# Patient Record
Sex: Male | Born: 1970 | Race: White | Hispanic: No | Marital: Married | Smoking: Never smoker
Health system: Southern US, Community
[De-identification: ages and names within clinical notes are randomized; demographics above are authoritative.]

## PROBLEM LIST (undated history)

## (undated) ENCOUNTER — Emergency Department (HOSPITAL_COMMUNITY): Payer: Managed Care, Other (non HMO) | Source: Home / Self Care

---

## 2015-11-22 ENCOUNTER — Encounter (HOSPITAL_COMMUNITY): Payer: Self-pay

## 2015-11-22 ENCOUNTER — Emergency Department (HOSPITAL_COMMUNITY): Payer: Managed Care, Other (non HMO)

## 2015-11-22 ENCOUNTER — Emergency Department (HOSPITAL_COMMUNITY)
Admission: EM | Admit: 2015-11-22 | Discharge: 2015-11-22 | Disposition: A | Discharge status: Home / Self Care | Payer: Managed Care, Other (non HMO) | Attending: Emergency Medicine | Admitting: Emergency Medicine

## 2015-11-22 DIAGNOSIS — R1013 Epigastric pain: Secondary | ICD-10-CM

## 2015-11-22 DIAGNOSIS — K2970 Gastritis, unspecified, without bleeding: Secondary | ICD-10-CM | POA: Insufficient documentation

## 2015-11-22 DIAGNOSIS — Z79899 Other long term (current) drug therapy: Secondary | ICD-10-CM | POA: Insufficient documentation

## 2015-11-22 LAB — CBC
HCT: 44.4 % (ref 39.0–52.0)
Hemoglobin: 15.4 g/dL (ref 13.0–17.0)
MCH: 30.4 pg (ref 26.0–34.0)
MCHC: 34.7 g/dL (ref 30.0–36.0)
MCV: 87.7 fL (ref 78.0–100.0)
PLATELETS: 215 10*3/uL (ref 150–400)
RBC: 5.06 MIL/uL (ref 4.22–5.81)
RDW: 12.9 % (ref 11.5–15.5)
WBC: 13.6 10*3/uL — ABNORMAL HIGH (ref 4.0–10.5)

## 2015-11-22 LAB — URINE MICROSCOPIC-ADD ON

## 2015-11-22 LAB — COMPREHENSIVE METABOLIC PANEL
ALBUMIN: 4.6 g/dL (ref 3.5–5.0)
ALK PHOS: 60 U/L (ref 38–126)
ALT: 46 U/L (ref 17–63)
AST: 33 U/L (ref 15–41)
Anion gap: 8 (ref 5–15)
BUN: 19 mg/dL (ref 6–20)
CALCIUM: 9.2 mg/dL (ref 8.9–10.3)
CO2: 25 mmol/L (ref 22–32)
CREATININE: 0.89 mg/dL (ref 0.61–1.24)
Chloride: 104 mmol/L (ref 101–111)
GFR calc non Af Amer: >60 mL/min (ref >60)
GLUCOSE: 130 mg/dL — AB (ref 65–99)
Potassium: 4.1 mmol/L (ref 3.5–5.1)
SODIUM: 137 mmol/L (ref 135–145)
Total Bilirubin: 0.9 mg/dL (ref 0.3–1.2)
Total Protein: 7.6 g/dL (ref 6.5–8.1)

## 2015-11-22 LAB — URINALYSIS, ROUTINE W REFLEX MICROSCOPIC
BILIRUBIN URINE: NEGATIVE
Glucose, UA: NEGATIVE mg/dL
Ketones, ur: NEGATIVE mg/dL
Leukocytes, UA: NEGATIVE
Nitrite: NEGATIVE
PROTEIN: NEGATIVE mg/dL
Specific Gravity, Urine: 1.033 — ABNORMAL HIGH (ref 1.005–1.030)
pH: 5.5 (ref 5.0–8.0)

## 2015-11-22 LAB — LIPASE, BLOOD: Lipase: 14 U/L (ref 11–51)

## 2015-11-22 MED ORDER — GI COCKTAIL ~~LOC~~
30.0000 mL | Freq: Once | ORAL | Status: AC
  Administered 2015-11-22: 30 mL via ORAL
  Filled 2015-11-22: qty 30

## 2015-11-22 MED ORDER — FAMOTIDINE IN NACL 20-0.9 MG/50ML-% IV SOLN
20.0000 mg | Freq: Once | INTRAVENOUS | Status: AC
  Administered 2015-11-22: 20 mg via INTRAVENOUS
  Filled 2015-11-22: qty 50

## 2015-11-22 MED ORDER — OXYCODONE-ACETAMINOPHEN 5-325 MG PO TABS: ORAL_TABLET | ORAL | Status: AC

## 2015-11-22 MED ORDER — MORPHINE SULFATE (PF) 2 MG/ML IV SOLN
2.0000 mg | Freq: Once | INTRAVENOUS | Status: AC
  Administered 2015-11-22: 2 mg via INTRAVENOUS
  Filled 2015-11-22: qty 1

## 2015-11-22 MED ORDER — IOPAMIDOL (ISOVUE-300) INJECTION 61%
100.0000 mL | Freq: Once | INTRAVENOUS | Status: AC | PRN
  Administered 2015-11-22: 100 mL via INTRAVENOUS

## 2015-11-22 MED ORDER — SUCRALFATE 1 G PO TABS
1.0000 g | ORAL_TABLET | Freq: Once | ORAL | Status: AC
  Administered 2015-11-22: 1 g via ORAL
  Filled 2015-11-22: qty 1

## 2015-11-22 MED ORDER — DIATRIZOATE MEGLUMINE & SODIUM 66-10 % PO SOLN
15.0000 mL | Freq: Once | ORAL | Status: AC
  Administered 2015-11-22: 15 mL via ORAL

## 2015-11-22 MED ORDER — SODIUM CHLORIDE 0.9 % IV BOLUS (SEPSIS)
1000.0000 mL | Freq: Once | INTRAVENOUS | Status: AC
  Administered 2015-11-22: 1000 mL via INTRAVENOUS

## 2015-11-22 MED ORDER — SUCRALFATE 1 GM/10ML PO SUSP: 1.0000 g | Freq: Three times a day (TID) | ORAL | Status: AC

## 2015-11-22 MED ORDER — OXYCODONE-ACETAMINOPHEN 5-325 MG PO TABS
1.0000 | ORAL_TABLET | Freq: Once | ORAL | Status: AC
  Administered 2015-11-22: 1 via ORAL
  Filled 2015-11-22: qty 1

## 2015-11-22 MED ORDER — PANTOPRAZOLE SODIUM 20 MG PO TBEC: 40.0000 mg | DELAYED_RELEASE_TABLET | Freq: Every day | ORAL | Status: AC

## 2015-11-22 MED ORDER — ONDANSETRON HCL 4 MG/2ML IJ SOLN
4.0000 mg | Freq: Once | INTRAMUSCULAR | Status: AC
  Administered 2015-11-22: 4 mg via INTRAVENOUS
  Filled 2015-11-22: qty 2

## 2015-11-22 MED ORDER — MORPHINE SULFATE (PF) 4 MG/ML IV SOLN
4.0000 mg | Freq: Once | INTRAVENOUS | Status: AC
  Administered 2015-11-22: 4 mg via INTRAVENOUS
  Filled 2015-11-22: qty 1

## 2015-11-22 NOTE — ED Notes (Signed)
Pt states at 1:30 am starting having central abdominal pain. Similar incident x 1 year ago.  Non radiating to back.  Denies n/v/d.  No fever.  No change in urination.  No hx of gastric dx.

## 2015-11-22 NOTE — Discharge Instructions (Signed)
Take percocet for breakthrough pain, do not drink alcohol, drive, care for children or do other critical tasks while taking percocet.  Please follow with your primary care doctor in the next 2 days for a check-up. They must obtain records for further management.   Do not hesitate to return to the Emergency Department for any new, worsening or concerning symptoms.   Please discuss getting a gastroenterology evaluation with your primary care doctor.   Gastritis, Adult Gastritis is soreness and swelling (inflammation) of the lining of the stomach. Gastritis can develop as a sudden onset (acute) or long-term (chronic) condition. If gastritis is not treated, it can lead to stomach bleeding and ulcers. CAUSES  Gastritis occurs when the stomach lining is weak or damaged. Digestive juices from the stomach then inflame the weakened stomach lining. The stomach lining may be weak or damaged due to viral or bacterial infections. One common bacterial infection is the Helicobacter pylori infection. Gastritis can also result from excessive alcohol consumption, taking certain medicines, or having too much acid in the stomach.  SYMPTOMS  In some cases, there are no symptoms. When symptoms are present, they may include:  Pain or a burning sensation in the upper abdomen.  Nausea.  Vomiting.  An uncomfortable feeling of fullness after eating. DIAGNOSIS  Your caregiver may suspect you have gastritis based on your symptoms and a physical exam. To determine the cause of your gastritis, your caregiver may perform the following:  Blood or stool tests to check for the H pylori bacterium.  Gastroscopy. A thin, flexible tube (endoscope) is passed down the esophagus and into the stomach. The endoscope has a light and camera on the end. Your caregiver uses the endoscope to view the inside of the stomach.  Taking a tissue sample (biopsy) from the stomach to examine under a microscope. TREATMENT  Depending on the  cause of your gastritis, medicines may be prescribed. If you have a bacterial infection, such as an H pylori infection, antibiotics may be given. If your gastritis is caused by too much acid in the stomach, H2 blockers or antacids may be given. Your caregiver may recommend that you stop taking aspirin, ibuprofen, or other nonsteroidal anti-inflammatory drugs (NSAIDs). HOME CARE INSTRUCTIONS  Only take over-the-counter or prescription medicines as directed by your caregiver.  If you were given antibiotic medicines, take them as directed. Finish them even if you start to feel better.  Drink enough fluids to keep your urine clear or pale yellow.  Avoid foods and drinks that make your symptoms worse, such as:  Caffeine or alcoholic drinks.  Chocolate.  Peppermint or mint flavorings.  Garlic and onions.  Spicy foods.  Citrus fruits, such as oranges, lemons, or limes.  Tomato-based foods such as sauce, chili, salsa, and pizza.  Fried and fatty foods.  Eat small, frequent meals instead of large meals. SEEK IMMEDIATE MEDICAL CARE IF:   You have black or dark red stools.  You vomit blood or material that looks like coffee grounds.  You are unable to keep fluids down.  Your abdominal pain gets worse.  You have a fever.  You do not feel better after 1 week.  You have any other questions or concerns. MAKE SURE YOU:  Understand these instructions.  Will watch your condition.  Will get help right away if you are not doing well or get worse.   This information is not intended to replace advice given to you by your health care provider. Make sure you discuss  any questions you have with your health care provider.   Document Released: 04/18/2001 Document Revised: 10/24/2011 Document Reviewed: 06/07/2011 Elsevier Interactive Patient Education [employer]2016 [employer]

## 2015-11-22 NOTE — ED Provider Notes (Signed)
CSN: [id number]     Arrival date & time 11/22/15  0913 History   First MD Initiated Contact with Patient 11/22/15 0945     Chief Complaint  Patient presents with  . Abdominal Pain     (Consider location/radiation/quality/duration/timing/severity/associated sxs/prior Treatment) HPI  Blood pressure 144/99, pulse 69, temperature 97.4 F (36.3 C), temperature source Oral, resp. rate 18, SpO2 100 %.  Antonio GulaBrian Siebers is a 45 y.o. male complaining of severe epigastric pain onset at 1:30 AM, woke him from sleep patient denies nausea, vomiting, fever, chills, change in urination states that stool is more pebble-like than normal. Denies melena, hematochezia, chest pain, shortness of breath, lightheadedness or fatigue. Location including Gas-X with little relief. Patient states he was seen for similar about 16 months ago and peripheral artery, he had a negative CT, it was recommended he obtain ultrasound. Of note, patient was given Dilaudid and his systolic blood pressure went to 80. She was advised to get an outpatient ultrasound of his gallbladder but he couldn't afford it. He denies postprandial exacerbation of pain.  History reviewed. No pertinent past medical history. History reviewed. No pertinent past surgical history. History reviewed. No pertinent family history. Social History  Substance Use Topics  . Smoking status: Never Smoker   . Smokeless tobacco: None  . Alcohol Use: Yes     Comment: social    Review of Systems  10 systems reviewed and found to be negative, except as noted in the HPI.  Allergies  Dilaudid  Home Medications   Prior to Admission medications   Medication Sig Start Date End Date Taking? Authorizing Provider  clonazePAM (KLONOPIN) 0.5 MG tablet Take 0.5 mg by mouth daily with breakfast. May also take 2 more a day if needed 08/18/15  Yes Historical Provider, MD  famotidine (PEPCID AC) 10 MG chewable tablet Chew 10 mg by mouth 2 (two) times daily as needed for  heartburn.   Yes Historical Provider, MD  sertraline (ZOLOFT) 100 MG tablet Take 100 mg by mouth daily with breakfast. 11/22/15  Yes Historical Provider, MD  simethicone (GAS-X) 80 MG chewable tablet Chew 160 mg by mouth every 6 (six) hours as needed for flatulence.   Yes Historical Provider, MD  oxyCODONE-acetaminophen (PERCOCET/ROXICET) 5-325 MG tablet 1 to 2 tabs PO q6hrs  PRN for pain 11/22/15   Joni ReiningNicole Raydon Chappuis, PA-C  pantoprazole (PROTONIX) 20 MG tablet Take 2 tablets (40 mg total) by mouth daily. 11/22/15   Kieffer Blatz, PA-C  sucralfate (CARAFATE) 1 GM/10ML suspension Take 10 mLs (1 g total) by mouth 4 (four) times daily -  with meals and at bedtime. 11/22/15   Maddilynn Esperanza, PA-C   BP 144/81 mmHg  Pulse 72  Temp(Src) 97.7 F (36.5 C) (Oral)  Resp 18  SpO2 98% Physical Exam  Constitutional: He is oriented to person, place, and time. He appears well-developed and well-nourished. No distress.  Appears acutely uncomfortable.  HENT:  Head: Normocephalic and atraumatic.  Mouth/Throat: Oropharynx is clear and moist.  Eyes: Conjunctivae and EOM are normal. Pupils are equal, round, and reactive to light.  Neck: Normal range of motion.  Cardiovascular: Normal rate, regular rhythm and intact distal pulses.   Pulmonary/Chest: Effort normal and breath sounds normal. No stridor.  Abdominal: Soft. Bowel sounds are normal. He exhibits no distension and no mass. There is no tenderness. There is no rebound and no guarding.  Normal bowel sounds, no tenderness to palpation of any quadrant  Musculoskeletal: Normal range of motion.  Neurological: He  is alert and oriented to person, place, and time.  Skin: He is not diaphoretic.  Psychiatric: He has a normal mood and affect.  Nursing note and vitals reviewed.   ED Course  Procedures (including critical care time) Labs Review Labs Reviewed  COMPREHENSIVE METABOLIC PANEL - Abnormal; Notable for the following:    Glucose, Bld 130 (*)    All  other components within normal limits  CBC - Abnormal; Notable for the following:    WBC 13.6 (*)    All other components within normal limits  URINALYSIS, ROUTINE W REFLEX MICROSCOPIC (NOT AT [facility]) - Abnormal; Notable for the following:    APPearance TURBID (*)    Specific Gravity, Urine 1.033 (*)    Hgb urine dipstick TRACE (*)    All other components within normal limits  URINE MICROSCOPIC-ADD ON - Abnormal; Notable for the following:    Squamous Epithelial / LPF 0-5 (*)    Bacteria, UA FEW (*)    All other components within normal limits  LIPASE, BLOOD    Imaging Review Ct Abdomen Pelvis W Contrast  11/22/2015  CLINICAL DATA:  Epigastric abdominal pain EXAM: CT ABDOMEN AND PELVIS WITH CONTRAST TECHNIQUE: Multidetector CT imaging of the abdomen and pelvis was performed using the standard protocol following bolus administration of intravenous contrast. CONTRAST:  ISOVUE-300 IOPAMIDOL (ISOVUE-300) INJECTION 61% COMPARISON:  Right upper quadrant abdominal ultrasound dated 11/22/2015 FINDINGS: Lower chest: Minimal dependent atelectasis in the bilateral lower lobes. Hepatobiliary: Liver is notable for hepatic steatosis with focal fatty sparing along the gallbladder fossa. Gallbladder is unremarkable. No intrahepatic or extrahepatic ductal dilatation. Pancreas: Within normal limits. Spleen: Within normal limits. Adrenals/Urinary Tract: Adrenal glands are within normal limits. 1.3 cm cyst in the lateral interpolar left kidney (series 2/ image 46). Right kidney is within normal limits. No hydronephrosis. Bladder is within normal limits. Stomach/Bowel: Stomach is within normal limits. No evidence of bowel obstruction. Normal appendix (series 2/ image 70). Vascular/Lymphatic: No evidence of abdominal aortic aneurysm. No suspicious abdominopelvic lymphadenopathy. Reproductive: Prostate is unremarkable. Other: No abdominopelvic ascites. Musculoskeletal: Mild degenerative changes of the visualized  thoracolumbar spine. 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 IMPRESSION: No evidence of bowel obstruction.  Normal appendix. Hepatic steatosis. No CT findings to account for the patient's abdominal pain. Electronically Signed   By: Charline Bills M.D.   On: 11/22/2015 11:57   US Abdomen Limited Ruq  11/22/2015  CLINICAL DATA:  Acute abdominal pain. EXAM: US ABDOMEN LIMITED - RIGHT UPPER QUADRANT COMPARISON:  None. FINDINGS: Gallbladder: Tiny stones appear to be present in the neck. These appear to be non mobile and measure up to 6 mm. No gallbladder wall thickening. No pericholecystic fluid collection. Common bile duct: Diameter: 3.1 mm Liver: Liver is is echogenic suggesting fatty infiltration and/or hepatocellular disease . No focal hepatic abnormality identified. IMPRESSION: 1. Tiny stones appear to be present in the gallbladder neck. These appear to be non mobile and measure up to 6 mm . No gallbladder distention. No gallbladder wall thickening or pericholecystic fluid collection. No biliary distention. 2. Liver is echogenic suggesting fatty infiltration and/or hepatocellular disease. Electronically Signed   By: Maisie Fus  Register   On: 11/22/2015 10:55   I have personally reviewed and evaluated these images and lab results as part of my medical decision-making.   EKG Interpretation None      MDM   Final diagnoses:  Acute epigastric pain  Gastritis    Filed Vitals:   11/22/15 0933 11/22/15 1158  BP: 144/99 144/81  Pulse: 69 72  Temp: 97.4 F (36.3 C) 97.7 F (36.5 C)  TempSrc: Oral Oral  Resp: 18 18  SpO2: 100% 98%    Medications  sucralfate (CARAFATE) tablet 1 g (not administered)  famotidine (PEPCID) IVPB 20 mg premix (not administered)  oxyCODONE-acetaminophen (PERCOCET/ROXICET) 5-325 MG per tablet 1 tablet (not administered)  morphine 2 MG/ML injection 2 mg (2 mg Intravenous Given 11/22/15 1018)  ondansetron (ZOFRAN) injection 4 mg (4 mg Intravenous  Given 11/22/15 1018)  sodium chloride 0.9 % bolus 1,000 mL (1,000 mLs Intravenous New Bag/Given 11/22/15 1018)  gi cocktail (Maalox,Lidocaine,Donnatal) (30 mLs Oral Given 11/22/15 1018)  diatrizoate meglumine-sodium (GASTROGRAFIN) 66-10 % solution 15 mL (15 mLs Oral Given 11/22/15 1027)  morphine 4 MG/ML injection 4 mg (4 mg Intravenous Given 11/22/15 1111)  iopamidol (ISOVUE-300) 61 % injection 100 mL (100 mLs Intravenous Contrast Given 11/22/15 1132)    Antonio Ibarra is 45 y.o. male presenting with Severe mid abdominal pain, similar episode one year ago. Abd exam is nonsurgical, patient is afebrile and nontoxic appearing, he does appear acutely uncomfortable. I doubt this is an anginal equivalent. He shouldn't has mild leukocytosis of 13.6, no significant electrolyte abnormality, specifically normal lipase and LFTs. Urinalysis is not consistent with infection, right upper quadrant ultrasound with some small gallstones and no wall thickening consistent with a cholecystitis. CT with no significant abnormality. This is likely a severe gastritis, I've advised this patient who is visiting the area to follow closely with his primary care physician for gastroenterology referral and possible EGD, will give him a prescription for Protonix and Carafate.  Evaluation does not show pathology that would require ongoing emergent intervention or inpatient treatment. Pt is hemodynamically stable and mentating appropriately. Discussed findings and plan with patient/guardian, who agrees with care plan. All questions answered. Return precautions discussed and outpatient follow up given.   New Prescriptions   OXYCODONE-ACETAMINOPHEN (PERCOCET/ROXICET) 5-325 MG TABLET    1 to 2 tabs PO q6hrs  PRN for pain   PANTOPRAZOLE (PROTONIX) 20 MG TABLET    Take 2 tablets (40 mg total) by mouth daily.   SUCRALFATE (CARAFATE) 1 GM/10ML SUSPENSION    Take 10 mLs (1 g total) by mouth 4 (four) times daily -  with meals and at bedtime.          Wynetta Emery, PA-C 11/22/15 1224  Lorre Nick, MD 11/24/15 1108

## 2015-11-22 NOTE — ED Notes (Signed)
Pt states he can call his friend to pick him up.

## 2015-11-22 NOTE — ED Notes (Signed)
Ambulated to the BR with steady gait

## 2017-03-01 IMAGING — US US ABDOMEN LIMITED
1 series · 14 of 25 positions shown · non-contrast
Comparison: None.

CLINICAL DATA: Acute abdominal pain.

EXAM:
US ABDOMEN LIMITED - RIGHT UPPER QUADRANT

[Series 1: us abdomen limited · 0.26mm/px · 14 of 86 slices shown]
[im 1/86]
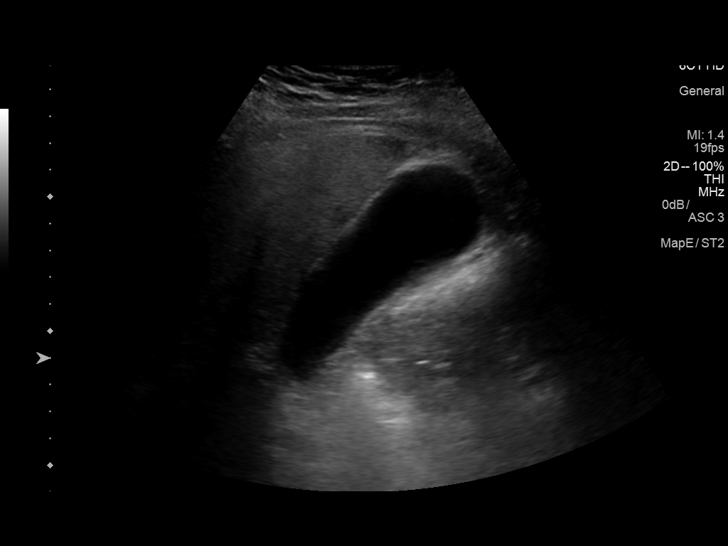
[im 8/86]
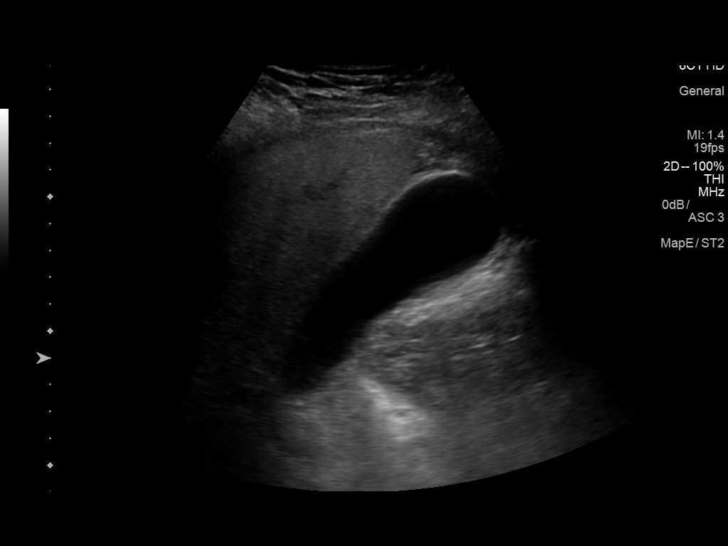
[im 15/86]
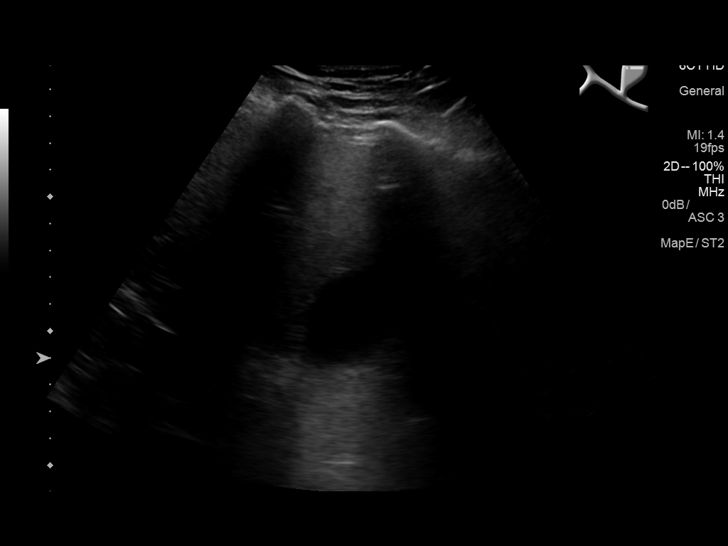
[im 22/86]
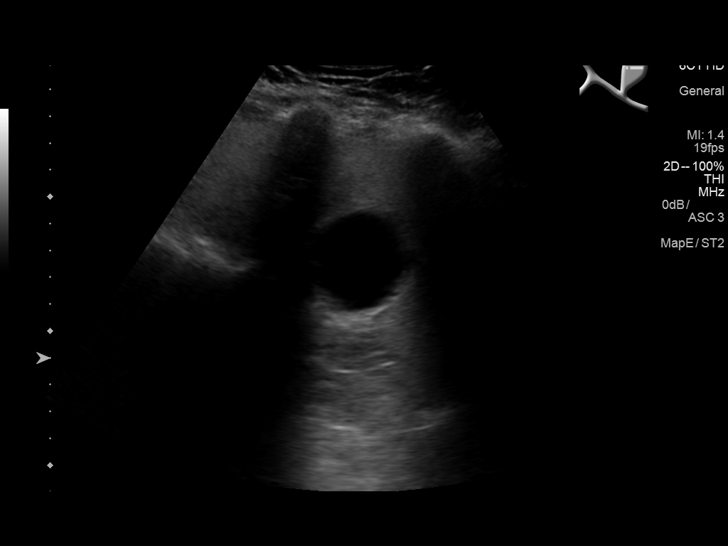
[im 29/86]
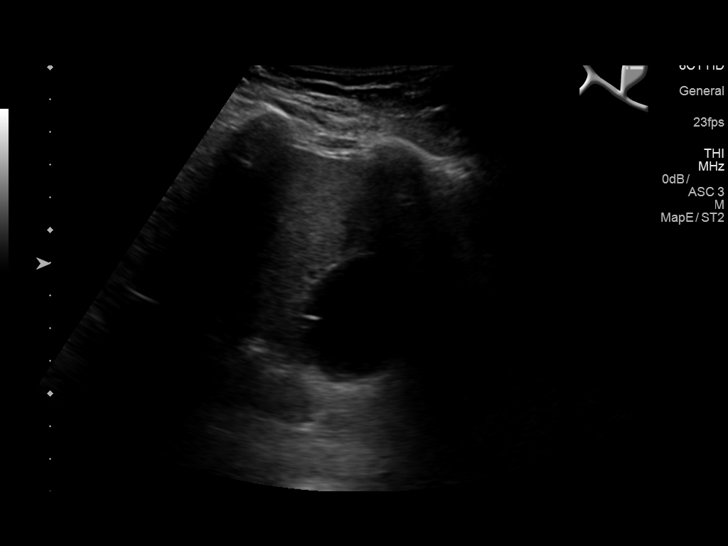
[im 32/86]
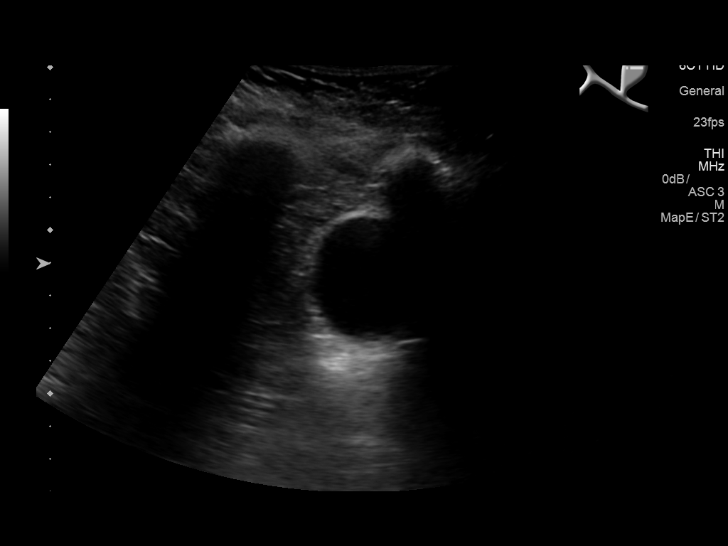
[im 39/86]
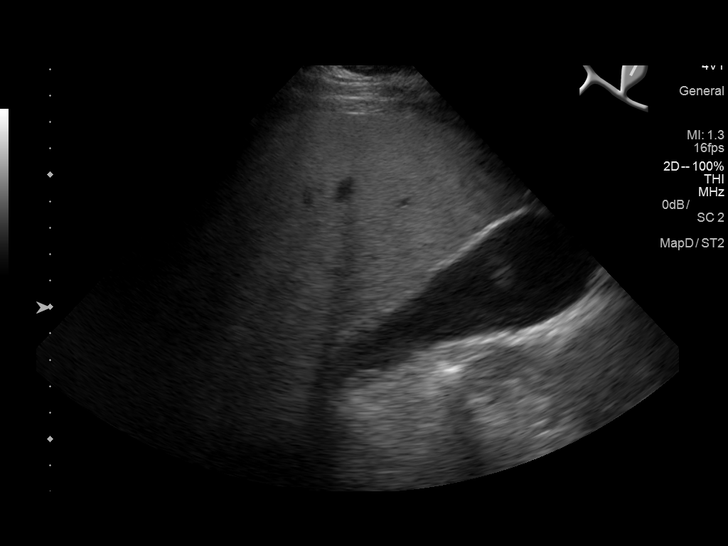
[im 47/86]
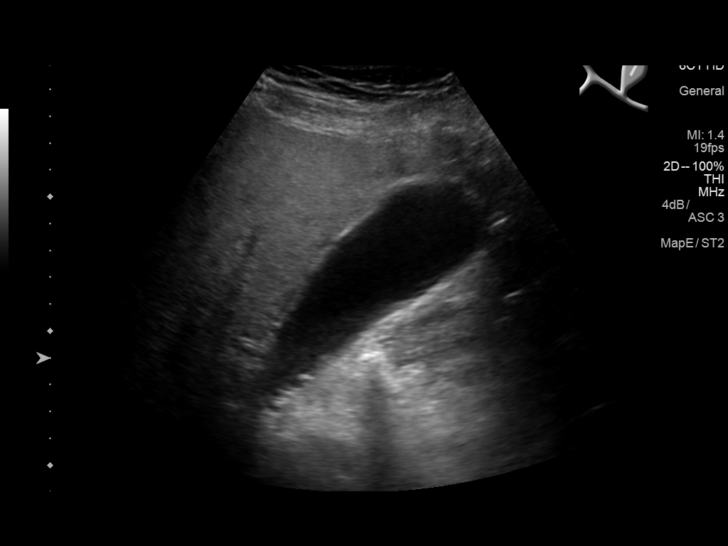
[im 54/86]
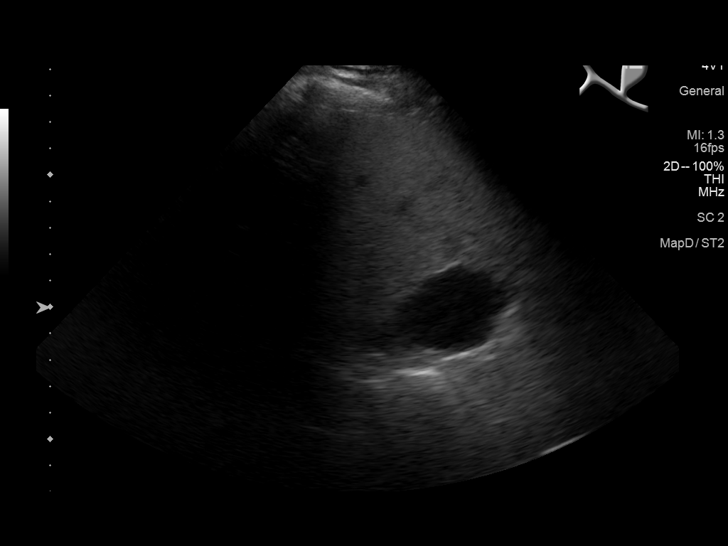
[im 57/86]
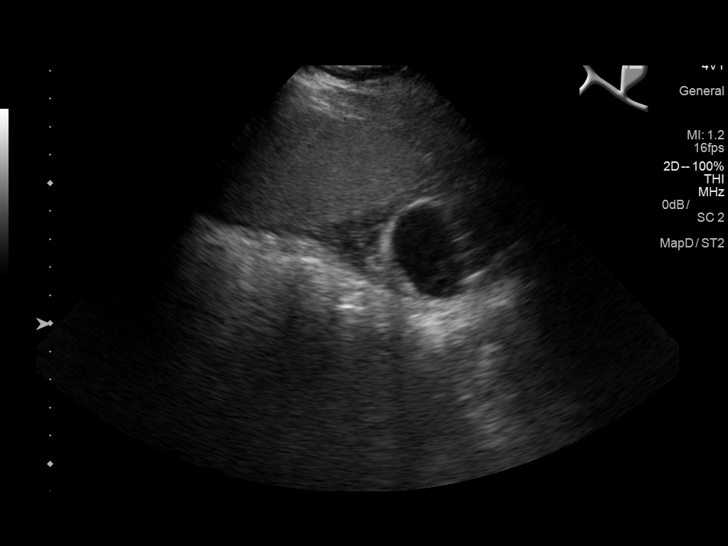
[im 64/86]
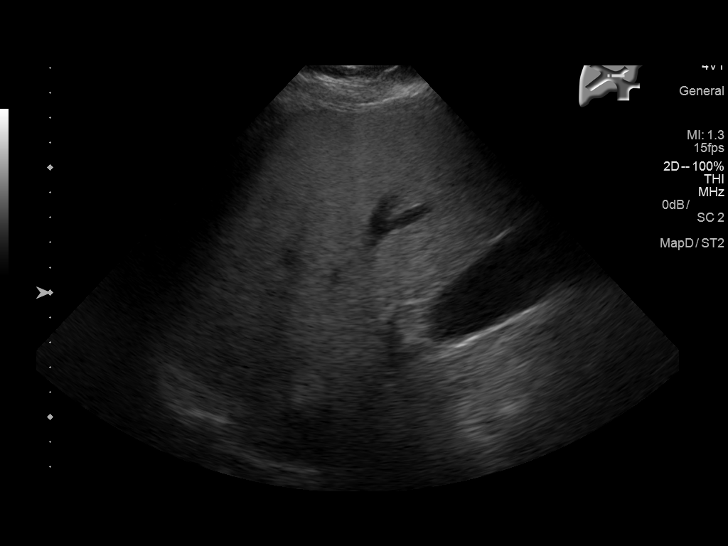
[im 71/86]
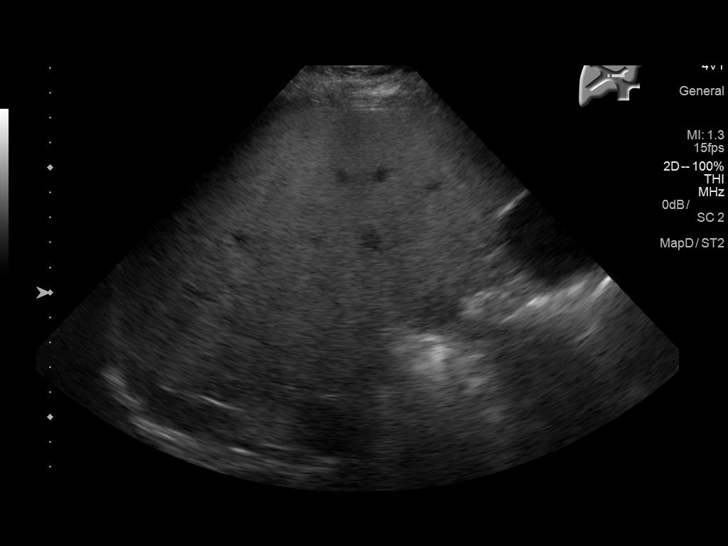
[im 78/86]
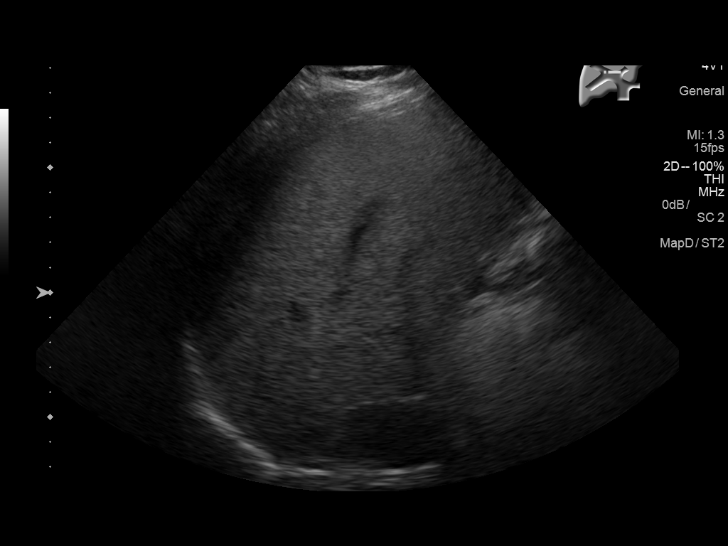
[im 86/86]
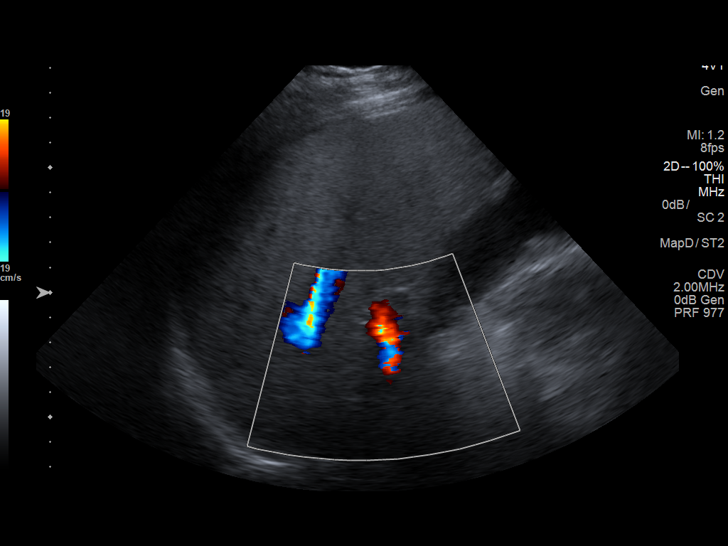

[14 of 25 positions shown; findings below may reference images not displayed]

FINDINGS: Gallbladder:

Tiny stones appear to be present in the neck. These appear to be non
mobile and measure up to 6 mm. No gallbladder wall thickening. No
pericholecystic fluid collection.

Common bile duct:

Diameter: 3.1 mm

Liver:

Liver is is echogenic suggesting fatty infiltration and/or
hepatocellular disease . No focal hepatic abnormality identified.
IMPRESSION: 1. Tiny stones appear to be present in the gallbladder neck. These
appear to be non mobile and measure up to 6 mm . No gallbladder
distention. No gallbladder wall thickening or pericholecystic fluid
collection. No biliary distention.

2. Liver is echogenic suggesting fatty infiltration and/or
hepatocellular disease.

## 2017-03-25 IMAGING — CT CT ABD-PELV W/ CM
2 of 5 series · 15 of 46 positions shown, 17 images · IV contrast (ISOVUE)
Comparison: Right upper quadrant abdominal ultrasound dated
11/22/2015

CLINICAL DATA: Epigastric abdominal pain

EXAM:
CT ABDOMEN AND PELVIS WITH CONTRAST
TECHNIQUE: Multidetector CT imaging of the abdomen and pelvis was performed
using the standard protocol following bolus administration of
intravenous contrast.
CONTRAST:  100mL HRQPRX-C99 IOPAMIDOL (HRQPRX-C99) INJECTION 61%

[Series 2: abd/pel with · axial · 0.80mm/px · z∈[-522,-47]mm · 12 of 113 slices shown, 14 images]
[im 9/113  soft-tissue]
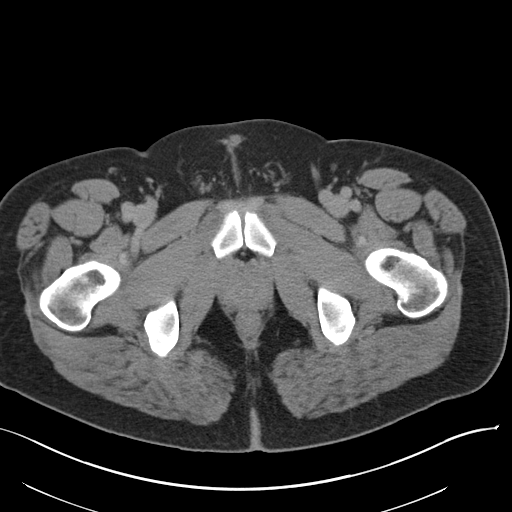
[im 9/113  bone]
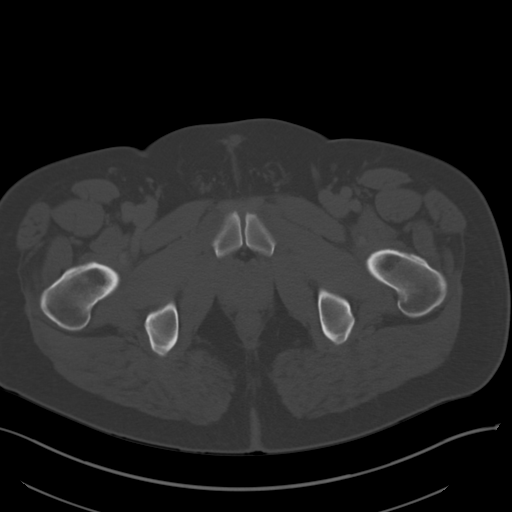
[im 18/113  soft-tissue]
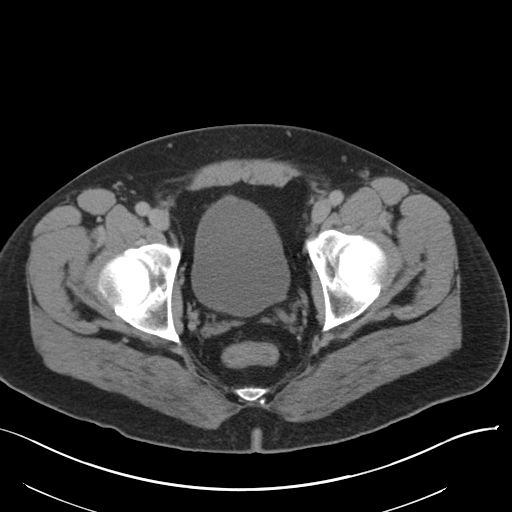
[im 26/113  soft-tissue]
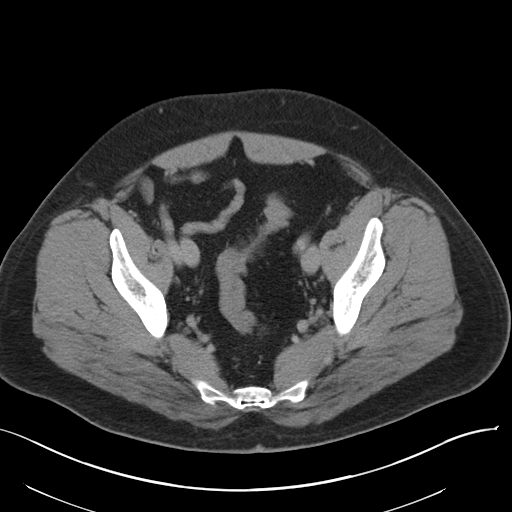
[im 35/113  soft-tissue]
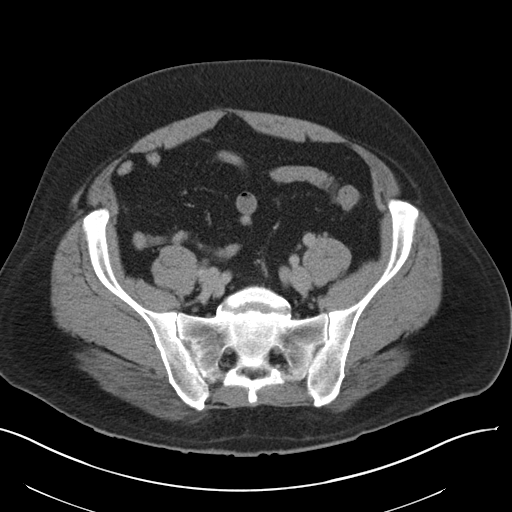
[im 44/113  soft-tissue]
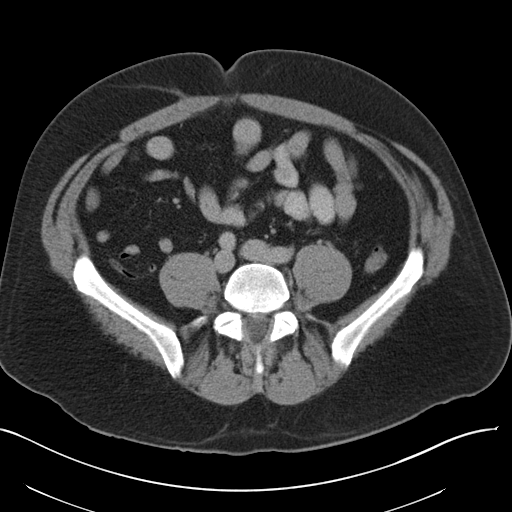
[im 52/113  soft-tissue]
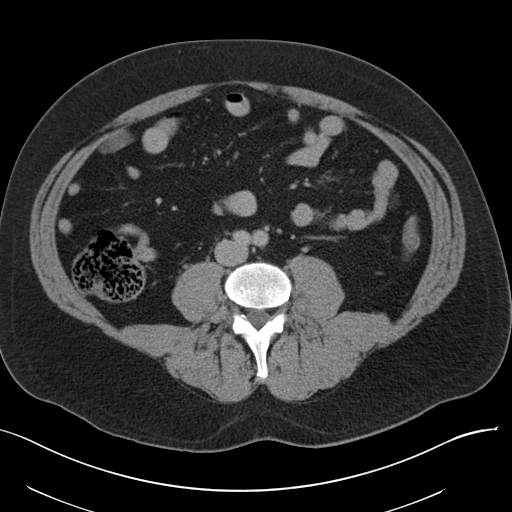
[im 61/113  soft-tissue]
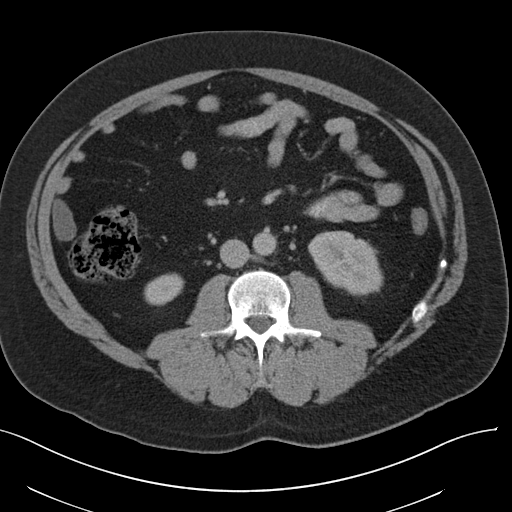
[im 69/113  soft-tissue]
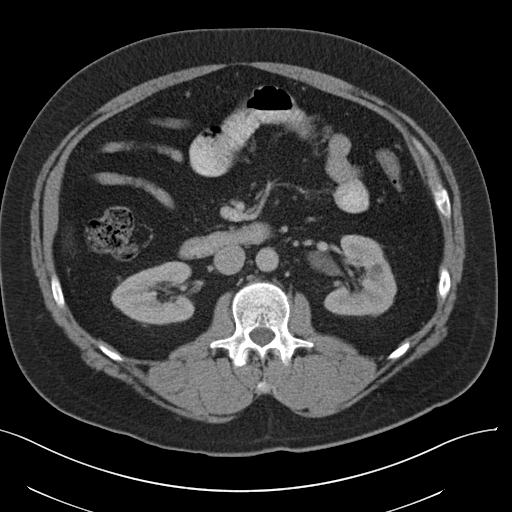
[im 78/113  soft-tissue]
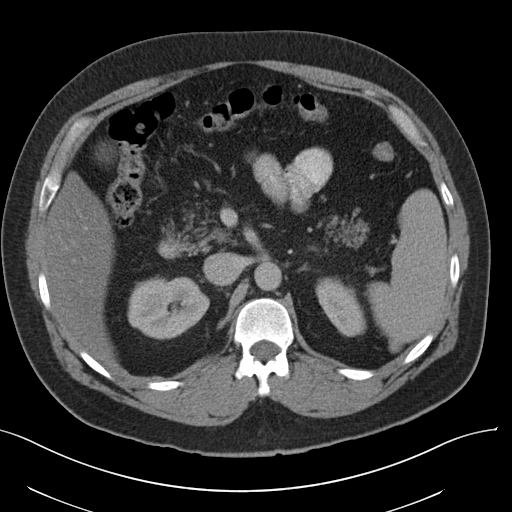
[im 78/113  bone]
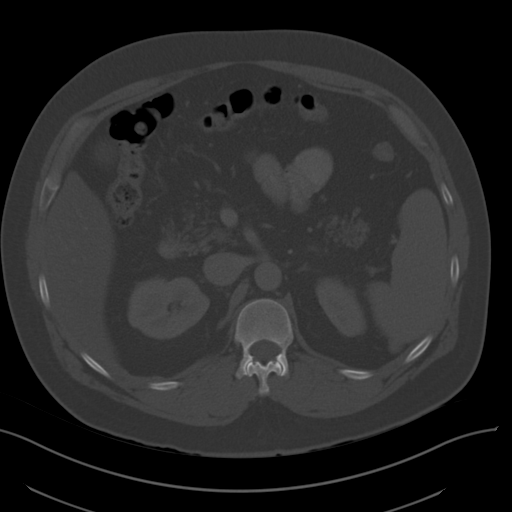
[im 87/113  soft-tissue]
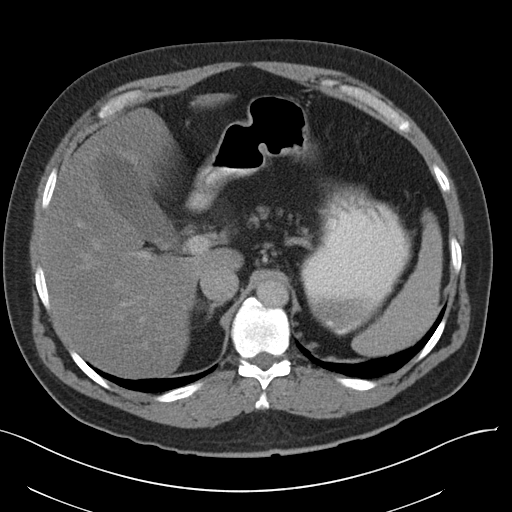
[im 95/113  soft-tissue]
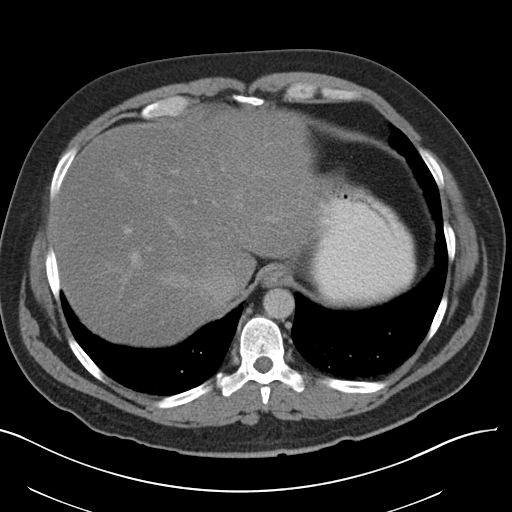
[im 104/113  soft-tissue]
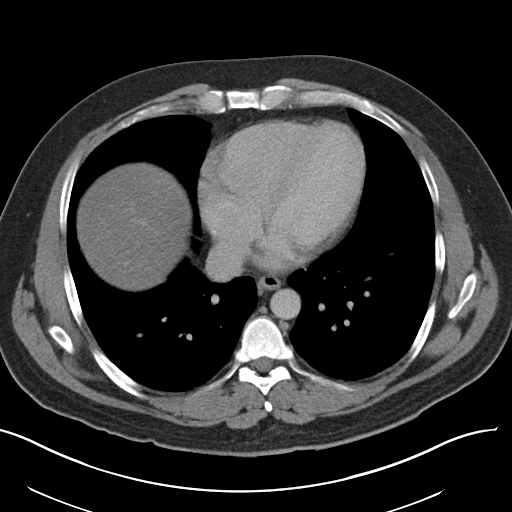

[Series 5: coronal a/|p · coronal · 0.83mm/px · 3 of 168 slices shown]
[im 56/168  soft-tissue]
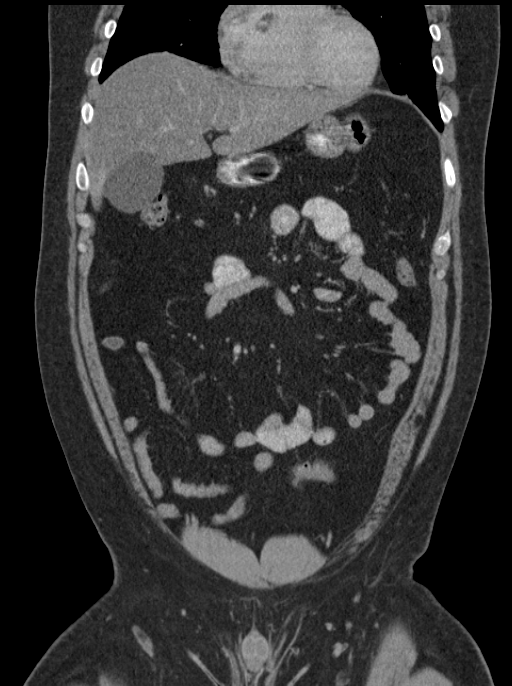
[im 75/168  soft-tissue]
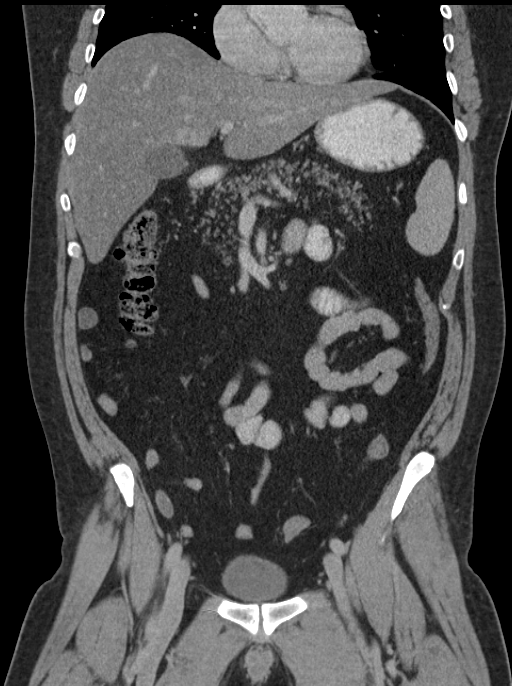
[im 93/168  soft-tissue]
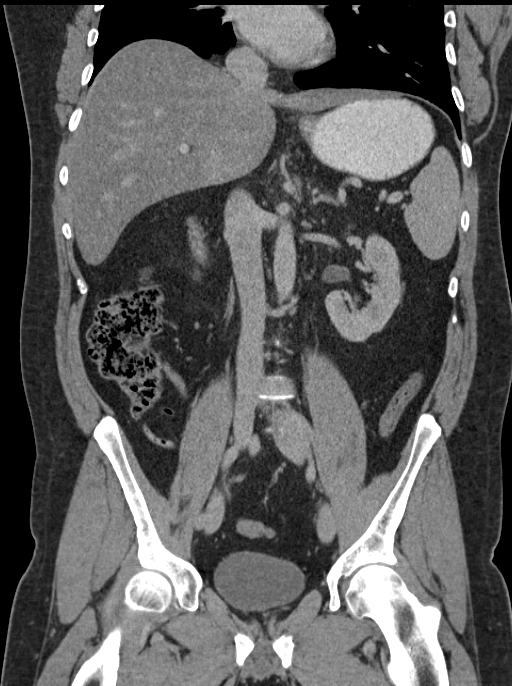

[15 of 46 positions shown; findings below may reference images not displayed]

FINDINGS: Lower chest: Minimal dependent atelectasis in the bilateral lower
lobes.

Hepatobiliary: Liver is notable for hepatic steatosis with focal
fatty sparing along the gallbladder fossa.

Gallbladder is unremarkable. No intrahepatic or extrahepatic ductal
dilatation.

Pancreas: Within normal limits.

Spleen: Within normal limits.

Adrenals/Urinary Tract: Adrenal glands are within normal limits.

1.3 cm cyst in the lateral interpolar left kidney (series 2/ image
46). Right kidney is within normal limits. No hydronephrosis.

Bladder is within normal limits.

Stomach/Bowel: Stomach is within normal limits.

No evidence of bowel obstruction.

Normal appendix (series 2/ image 70).

Vascular/Lymphatic: No evidence of abdominal aortic aneurysm.

No suspicious abdominopelvic lymphadenopathy.

Reproductive: Prostate is unremarkable.

Other: No abdominopelvic ascites.

Musculoskeletal: Mild degenerative changes of the visualized
thoracolumbar spine.

Bilateral pars defects at L5-S1, without spondylolisthesis.
IMPRESSION: No evidence of bowel obstruction.  Normal appendix.

Hepatic steatosis.

No CT findings to account for the patient's abdominal pain.
